# Patient Record
Sex: Female | Born: 1955 | Race: White | Hispanic: No | Marital: Married | Smoking: Never smoker
Health system: Southern US, Community
[De-identification: ages and names within clinical notes are randomized; demographics above are authoritative.]

---

## 1998-03-06 ENCOUNTER — Other Ambulatory Visit: Admission: RE | Admit: 1998-03-06 | Discharge: 1998-03-06 | Payer: Self-pay | Admitting: Obstetrics and Gynecology

## 1999-03-14 ENCOUNTER — Other Ambulatory Visit: Admission: RE | Admit: 1999-03-14 | Discharge: 1999-03-14 | Payer: Self-pay | Admitting: Obstetrics and Gynecology

## 1999-05-16 ENCOUNTER — Encounter: Admission: RE | Admit: 1999-05-16 | Discharge: 1999-05-16 | Payer: Self-pay | Admitting: Obstetrics and Gynecology

## 1999-05-16 ENCOUNTER — Encounter: Payer: Self-pay | Admitting: Obstetrics and Gynecology

## 2003-02-15 ENCOUNTER — Encounter: Admission: RE | Admit: 2003-02-15 | Discharge: 2003-02-15 | Payer: Self-pay | Admitting: Internal Medicine

## 2003-02-15 ENCOUNTER — Encounter: Payer: Self-pay | Admitting: Internal Medicine

## 2004-02-24 ENCOUNTER — Encounter: Admission: RE | Admit: 2004-02-24 | Discharge: 2004-02-24 | Payer: Self-pay | Admitting: Internal Medicine

## 2009-01-11 ENCOUNTER — Encounter: Admission: RE | Admit: 2009-01-11 | Discharge: 2009-01-11 | Payer: Self-pay | Admitting: Family Medicine

## 2010-06-15 IMAGING — MG MM DIGITAL SCREENING BILAT W/ CAD
4 series · 4 of 4 positions shown · non-contrast
Comparison: Prior studies.

DG SCREEN MAMMOGRAM BILATERAL
Bilateral CC and MLO view(s) were taken.

DIGITAL SCREENING MAMMOGRAM WITH CAD:

[R CC]
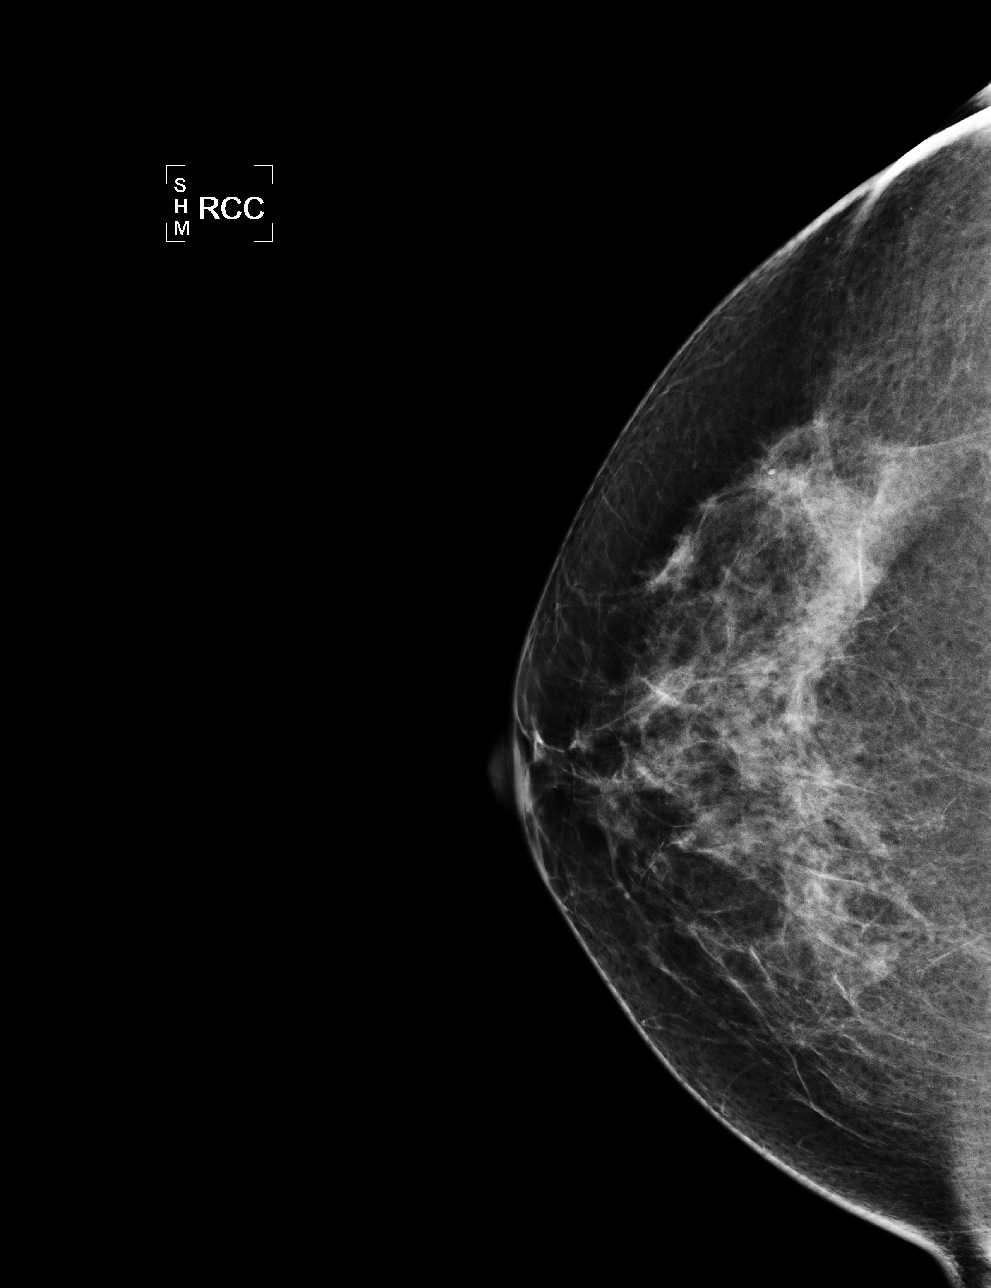

[L CC]
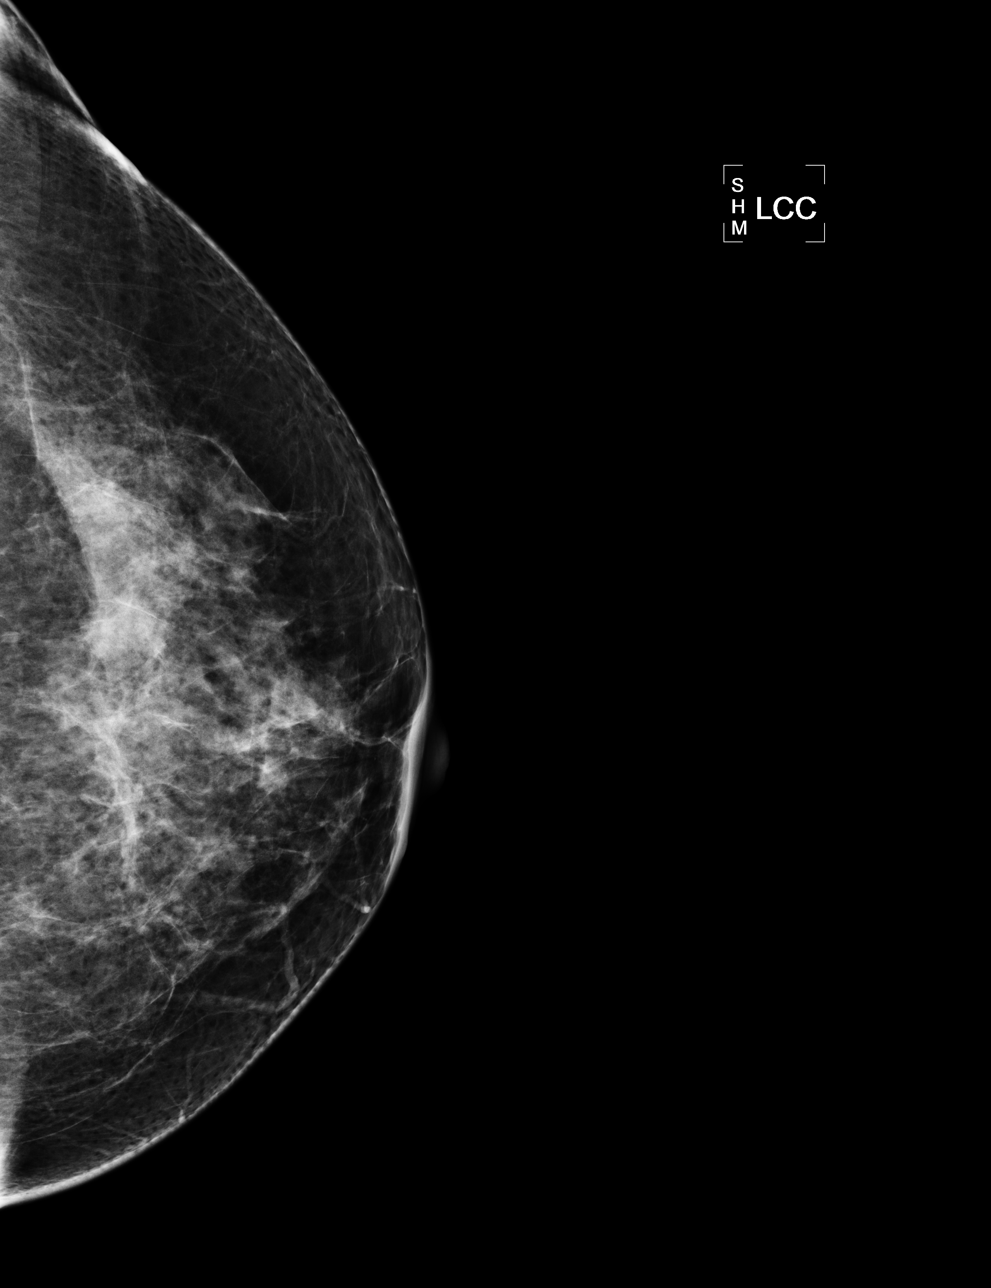

[L MLO]
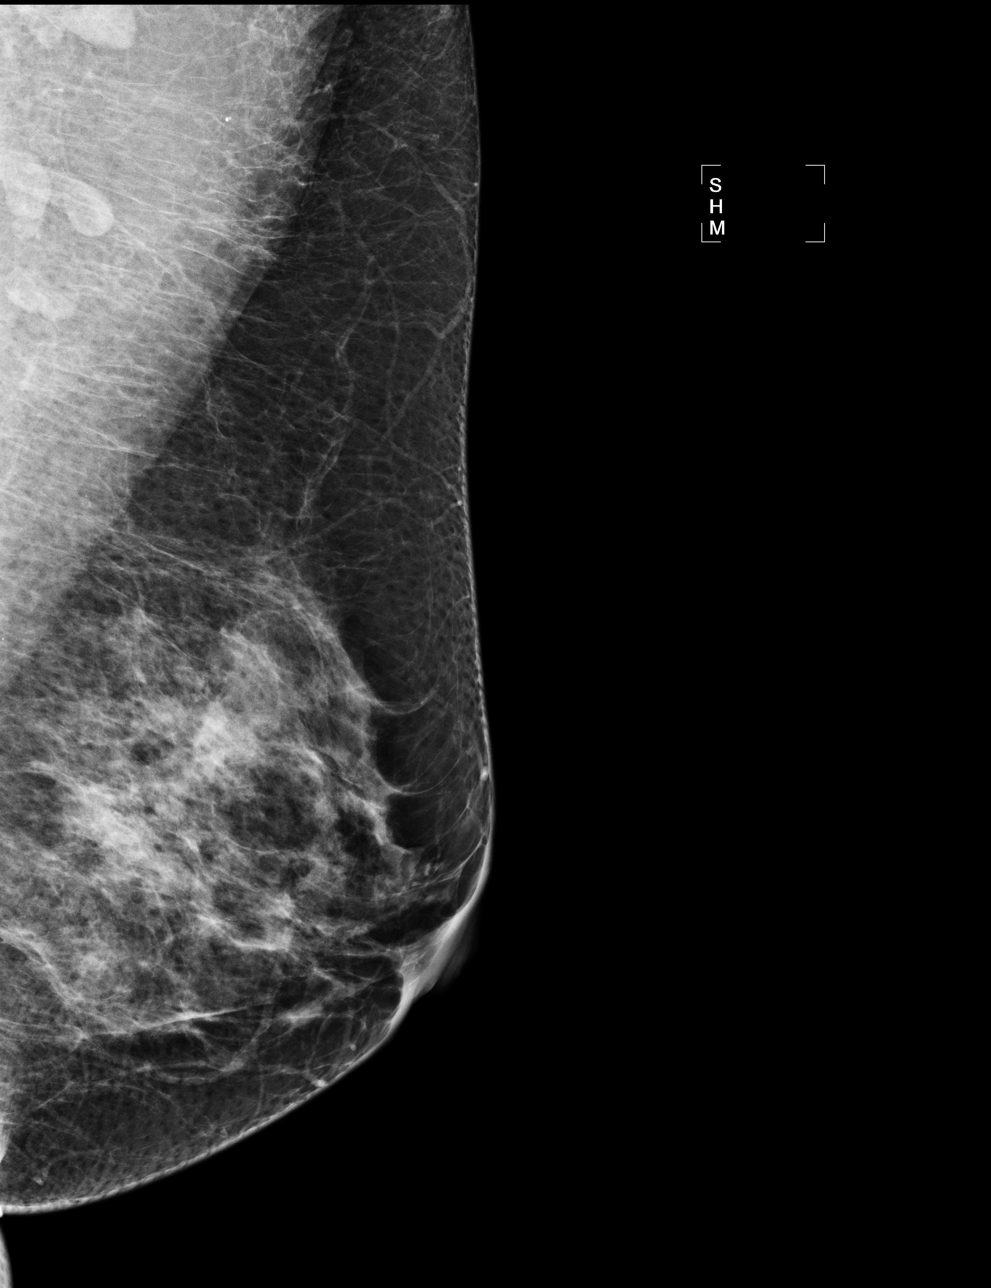

[R MLO]
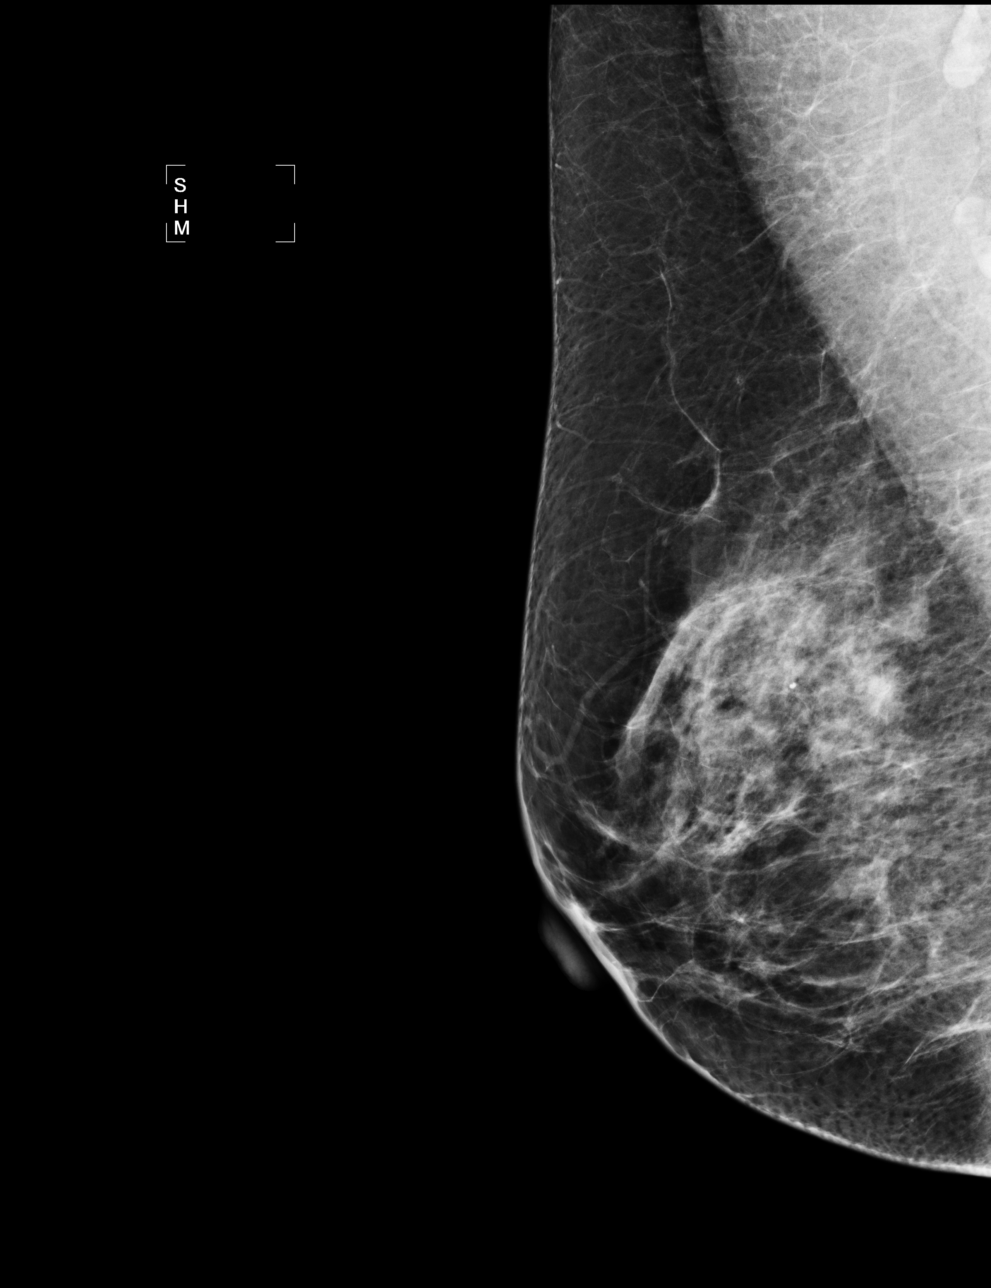

[4 of 4 positions shown; findings below may reference images not displayed]

The breast tissue is heterogeneously dense.  There is no dominant mass, architectural distortion or
calcification to suggest malignancy.

Images were processed with CAD.
IMPRESSION: No mammographic evidence of malignancy.  Suggest yearly screening mammography.

A result letter of this screening mammogram will be mailed directly to the patient.

ASSESSMENT: Negative - BI-RADS 1

Screening mammogram in 1 year.
ANALYZED BY COMPUTER AIDED DETECTION. , THIS PROCEDURE WAS A DIGITAL MAMMOGRAM.

## 2013-05-12 DIAGNOSIS — L219 Seborrheic dermatitis, unspecified: Secondary | ICD-10-CM | POA: Insufficient documentation

## 2013-05-12 DIAGNOSIS — Z0000 Encounter for general adult medical examination without abnormal findings: Secondary | ICD-10-CM | POA: Insufficient documentation

## 2013-05-12 DIAGNOSIS — E7800 Pure hypercholesterolemia, unspecified: Secondary | ICD-10-CM | POA: Insufficient documentation

## 2014-05-16 DIAGNOSIS — M8580 Other specified disorders of bone density and structure, unspecified site: Secondary | ICD-10-CM | POA: Insufficient documentation

## 2014-12-02 DIAGNOSIS — R7309 Other abnormal glucose: Secondary | ICD-10-CM | POA: Insufficient documentation

## 2021-03-20 ENCOUNTER — Other Ambulatory Visit: Payer: Self-pay | Admitting: Nurse Practitioner

## 2021-03-20 DIAGNOSIS — Z1231 Encounter for screening mammogram for malignant neoplasm of breast: Secondary | ICD-10-CM

## 2023-01-05 ENCOUNTER — Encounter (HOSPITAL_BASED_OUTPATIENT_CLINIC_OR_DEPARTMENT_OTHER): Payer: Self-pay | Admitting: Emergency Medicine

## 2023-01-05 ENCOUNTER — Emergency Department (HOSPITAL_BASED_OUTPATIENT_CLINIC_OR_DEPARTMENT_OTHER)
Admission: EM | Admit: 2023-01-05 | Discharge: 2023-01-05 | Disposition: A | Discharge status: Home / Self Care | Payer: Medicare HMO | Attending: Emergency Medicine | Admitting: Emergency Medicine

## 2023-01-05 ENCOUNTER — Emergency Department (HOSPITAL_BASED_OUTPATIENT_CLINIC_OR_DEPARTMENT_OTHER): Payer: Medicare HMO

## 2023-01-05 DIAGNOSIS — R0789 Other chest pain: Secondary | ICD-10-CM | POA: Insufficient documentation

## 2023-01-05 DIAGNOSIS — I10 Essential (primary) hypertension: Secondary | ICD-10-CM | POA: Insufficient documentation

## 2023-01-05 DIAGNOSIS — E119 Type 2 diabetes mellitus without complications: Secondary | ICD-10-CM | POA: Insufficient documentation

## 2023-01-05 DIAGNOSIS — R079 Chest pain, unspecified: Secondary | ICD-10-CM

## 2023-01-05 DIAGNOSIS — R42 Dizziness and giddiness: Secondary | ICD-10-CM | POA: Diagnosis not present

## 2023-01-05 LAB — BASIC METABOLIC PANEL
Anion gap: 8 (ref 5–15)
BUN: 17 mg/dL (ref 8–23)
CO2: 25 mmol/L (ref 22–32)
Calcium: 9.1 mg/dL (ref 8.9–10.3)
Chloride: 105 mmol/L (ref 98–111)
Creatinine, Ser: 0.71 mg/dL (ref 0.44–1.00)
GFR, Estimated: >60 mL/min (ref >60)
Glucose, Bld: 159 mg/dL — ABNORMAL HIGH (ref 70–99)
Potassium: 3.6 mmol/L (ref 3.5–5.1)
Sodium: 138 mmol/L (ref 135–145)

## 2023-01-05 LAB — CBC WITH DIFFERENTIAL/PLATELET
Abs Immature Granulocytes: 0.01 10*3/uL (ref 0.00–0.07)
Basophils Absolute: 0 10*3/uL (ref 0.0–0.1)
Basophils Relative: 1 %
Eosinophils Absolute: 0.1 10*3/uL (ref 0.0–0.5)
Eosinophils Relative: 2 %
HCT: 35.7 % — ABNORMAL LOW (ref 36.0–46.0)
Hemoglobin: 11.8 g/dL — ABNORMAL LOW (ref 12.0–15.0)
Immature Granulocytes: 0 %
Lymphocytes Relative: 36 %
Lymphs Abs: 2.2 10*3/uL (ref 0.7–4.0)
MCH: 31 pg (ref 26.0–34.0)
MCHC: 33.1 g/dL (ref 30.0–36.0)
MCV: 93.7 fL (ref 80.0–100.0)
Monocytes Absolute: 0.3 10*3/uL (ref 0.1–1.0)
Monocytes Relative: 5 %
Neutro Abs: 3.4 10*3/uL (ref 1.7–7.7)
Neutrophils Relative %: 56 %
Platelets: 183 10*3/uL (ref 150–400)
RBC: 3.81 MIL/uL — ABNORMAL LOW (ref 3.87–5.11)
RDW: 13 % (ref 11.5–15.5)
WBC: 6 10*3/uL (ref 4.0–10.5)
nRBC: 0 % (ref 0.0–0.2)

## 2023-01-05 LAB — TROPONIN I (HIGH SENSITIVITY): Troponin I (High Sensitivity): <2 ng/L (ref <18)

## 2023-01-05 NOTE — ED Provider Notes (Signed)
[place] [facility] Provider Note   CSN: [id number] Arrival date & time: 01/05/23  4098     History  Chief Complaint  Patient presents with   Chest Pain    Sarah Campos is a 67 y.o. female.  Patient here with chest discomfort.  She has been having chest discomfort for the last 8 to 10 hours.  May be some chest pain here and there in the last few weeks.  Nonexertional.  Patient feels little lightheaded at times but denies any headache or numbness or tingling.  No speech changes.  Denies any shortness of breath or recent surgery or travel.  Denies any abdominal pain nausea vomit diarrhea.  She has no medical problems.  Does not take any medicine for hypertension, diabetes or high cholesterol.  No blood clot history in the family, no cardiac disease at young age in the family.  Denies any cough or sputum production.  The history is provided by the patient.       Home Medications Prior to Admission medications   Not on File      Allergies    Morphine    Review of Systems   Review of Systems  Physical Exam Updated Vital Signs BP 129/71   Pulse 72   Temp (!) 97.5 F (36.4 C) (Oral)   Resp 20   Ht 5\' 3"  (1.6 m)   Wt 63 kg   SpO2 95%   BMI 24.62 kg/m  Physical Exam Vitals and nursing note reviewed.  Constitutional:      General: She is not in acute distress.    Appearance: She is well-developed. She is not ill-appearing.  HENT:     Head: Normocephalic and atraumatic.  Eyes:     Extraocular Movements: Extraocular movements intact.     Conjunctiva/sclera: Conjunctivae normal.     Pupils: Pupils are equal, round, and reactive to light.  Cardiovascular:     Rate and Rhythm: Normal rate and regular rhythm.     Pulses:          Radial pulses are 2+ on the right side and 2+ on the left side.     Heart sounds: No murmur heard. Pulmonary:     Effort: Pulmonary effort is normal. No respiratory distress.     Breath sounds: Normal  breath sounds.  Abdominal:     Palpations: Abdomen is soft.     Tenderness: There is no abdominal tenderness.  Musculoskeletal:        General: No swelling. Normal range of motion.     Cervical back: Normal range of motion and neck supple.  Skin:    General: Skin is warm and dry.     Capillary Refill: Capillary refill takes less than 2 seconds.  Neurological:     General: No focal deficit present.     Mental Status: She is alert and oriented to person, place, and time.     Cranial Nerves: No cranial nerve deficit.     Motor: No weakness.  Psychiatric:        Mood and Affect: Mood normal.     ED Results / Procedures / Treatments   Labs (all labs ordered are listed, but only abnormal results are displayed) Labs Reviewed  CBC WITH DIFFERENTIAL/PLATELET - Abnormal; Notable for the following components:      Result Value   RBC 3.81 (*)    Hemoglobin 11.8 (*)    HCT 35.7 (*)    All  other components within normal limits  BASIC METABOLIC PANEL - Abnormal; Notable for the following components:   Glucose, Bld 159 (*)    All other components within normal limits  TROPONIN I (HIGH SENSITIVITY)    EKG EKG Interpretation Date/Time:  Sunday January 05 2023 08:02:50 EDT Ventricular Rate:  74 PR Interval:  135 QRS Duration:  88 QT Interval:  380 QTC Calculation: 422 R Axis:   48  Text Interpretation: Sinus rhythm Confirmed by Virgina Norfolk [id number]) on 01/05/2023 8:04:49 AM  Radiology No results found.  Procedures Procedures    Medications Ordered in ED Medications - No data to display  ED Course/ Medical Decision Making/ A&P             HEART Score: 2                Medical Decision Making Amount and/or Complexity of Data Reviewed Labs: ordered. Radiology: ordered.   Sarah Campos is here with chest pain.  Normal vitals.  No fever.  No significant medical history.  Wells criteria 0 and doubt PE.  Heart score 2.  EKG shows sinus rhythm with no ischemic changes per my  review and interpretation.  Overall atypical story for ACS.  She has been having chest pain on and off for the last month that is nonspecific.  Happens at rest maybe with exertion not sure if it is worse with eating or not.  Overall chest pain now for the last 8 to 10 hours.  Was having a hard time sleeping.  Differential diagnosis could be MSK or reflux related process or stress related process seems less likely to be ACS, Wells criteria 0 doubt PE.  No concern for dissection.  Seems less likely to be infectious process.  Will check CBC, BMP, troponin and reevaluate.  Per my review and interpretation of labs and imaging there is no acute findings.  No pneumonia or pneumothorax.  Troponin undetectable.  No significant anemia, electrolyte abnormality, kidney injury or leukocytosis otherwise.  Overall low heart score.  Atypical story.  Will have her continue workup outpatient with cardiology.  Recommend follow-up with primary care doctor.  Understands return precautions.  Discharged in good condition.  This chart was dictated using voice recognition software.  Despite best efforts to proofread,  errors can occur which can change the documentation meaning.         Final Clinical Impression(s) / ED Diagnoses Final diagnoses:  Nonspecific chest pain    Rx / DC Orders ED Discharge Orders          Ordered    Ambulatory referral to Cardiology       Comments: If you have not heard from the Cardiology office within the next 72 hours please call [phone].   01/05/23 0848              Virgina Norfolk, DO 01/05/23 [phone]

## 2023-01-05 NOTE — Discharge Instructions (Signed)
Follow-up with primary care doctor.  I have also placed an order for referral to cardiology.  Please call them if you have not heard from them.  Your workup today was unremarkable.

## 2023-01-05 NOTE — ED Triage Notes (Signed)
Pt reports upper chest pain intermittently for the past month but seems to be getting worse today. Some lightheadedness, but hx of vertigo and dizziness feels similar. No shortness of breath or nausea.

## 2023-03-19 ENCOUNTER — Encounter: Payer: Self-pay | Admitting: Cardiology

## 2023-03-19 ENCOUNTER — Ambulatory Visit: Payer: Medicare HMO | Attending: Cardiology | Admitting: Cardiology

## 2023-03-19 VITALS — BP 130/78 | HR 76 | Ht 62.5 in | Wt 136.0 lb

## 2023-03-19 DIAGNOSIS — R011 Cardiac murmur, unspecified: Secondary | ICD-10-CM | POA: Diagnosis not present

## 2023-03-19 DIAGNOSIS — E7800 Pure hypercholesterolemia, unspecified: Secondary | ICD-10-CM

## 2023-03-19 DIAGNOSIS — R079 Chest pain, unspecified: Secondary | ICD-10-CM | POA: Diagnosis not present

## 2023-03-19 DIAGNOSIS — R7309 Other abnormal glucose: Secondary | ICD-10-CM | POA: Diagnosis not present

## 2023-03-19 MED ORDER — METOPROLOL TARTRATE 100 MG PO TABS: ORAL_TABLET | ORAL | 0 refills | Status: DC

## 2023-03-19 MED ORDER — NITROGLYCERIN 0.4 MG SL SUBL: 0.4000 mg | SUBLINGUAL_TABLET | SUBLINGUAL | 6 refills | Status: AC | PRN

## 2023-03-19 NOTE — Patient Instructions (Addendum)
Medication Instructions:   TAKE: Metoprolol 100mg  1 tablet 2 hours prior to CT scan   Lab Work: BMP- today 3rd Floor [address] If you have labs (blood work) drawn today and your tests are completely normal, you will receive your results only by: MyChart Message (if you have MyChart) OR A paper copy in the mail If you have any lab test that is abnormal or we need to change your treatment, we will call you to review the results.   Testing/Procedures: Your physician has requested that you have an echocardiogram. Echocardiography is a painless test that uses sound waves to create images of your heart. It provides your doctor with information about the size and shape of your heart and how well your heart's chambers and valves are working. This procedure takes approximately one hour. There are no restrictions for this procedure. Please do NOT wear cologne, perfume, aftershave, or lotions (deodorant is allowed). Please arrive 15 minutes prior to your appointment time.   Your cardiac CT will be scheduled at one of the below locations:   [facility] [address] [place], [place] [address] [phone]    If scheduled at [facility], please arrive at the [facility] Entrance (Entrance C2) of [facility] 30 minutes prior to test start time. You can use the FREE valet parking offered at entrance C (encouraged to control the heart rate for the test)  Proceed to the [facility] Radiology Department (first floor) to check-in and test prep.  All radiology patients and guests should use entrance C2 at [facility], accessed from [facility], even though the hospital's physical address listed is [address]       Please follow these instructions carefully (unless otherwise directed):     On the Night Before the Test: Be sure to Drink plenty of water. Do not consume any caffeinated/decaffeinated beverages or chocolate  12 hours prior to your test. Do not take any antihistamines 12 hours prior to your test.  On the Day of the Test: Drink plenty of water until 1 hour prior to the test. Do not eat any food 4 hours prior to the test. You may take your regular medications prior to the test.  Take metoprolol (Lopressor) two hours prior to test. FEMALES- please wear underwire-free bra if available, avoid dresses & tight clothing       After the Test: Drink plenty of water. After receiving IV contrast, you may experience a mild flushed feeling. This is normal. On occasion, you may experience a mild rash up to 24 hours after the test. This is not dangerous. If this occurs, you can take Benadryl 25 mg and increase your fluid intake. If you experience trouble breathing, this can be serious. If it is severe call 911 IMMEDIATELY. If it is mild, please call our office. If you take any of these medications: Glipizide/Metformin, Avandament, Glucavance, please do not take 48 hours after completing test unless otherwise instructed.  We will call to schedule your test 2-4 weeks out understanding that some insurance companies will need an authorization prior to the service being performed.   For non-scheduling related questions, please contact the cardiac imaging nurse navigator should you have any questions/concerns: Rockwell Alexandria, Cardiac Imaging Nurse Navigator Larey Brick, Cardiac Imaging Nurse Navigator [facility] Direct Office Dial: [phone]   For scheduling needs, including cancellations and rescheduling, please call [place], [phone].     Follow-Up: At  [facility], you and your health needs are our priority.  As part of our continuing mission to provide you with exceptional heart care, we have created designated Provider Care Teams.  These Care Teams include your primary Cardiologist (physician) and Advanced Practice Providers (APPs -  Physician Assistants and Nurse  Practitioners) who all work together to provide you with the care you need, when you need it.  We recommend signing up for the patient portal called "MyChart".  Sign up information is provided on this After Visit Summary.  MyChart is used to connect with patients for Virtual Visits (Telemedicine).  Patients are able to view lab/test results, encounter notes, upcoming appointments, etc.  Non-urgent messages can be sent to your provider as well.   To learn more about what you can do with MyChart, go to [email or web address].    Your next appointment:   2 month(s)  The format for your next appointment:   In Person  Provider:   Gypsy Balsam, MD    Other Instructions NA

## 2023-03-19 NOTE — Progress Notes (Signed)
Cardiology Consultation:    Date:  03/19/2023   ID:  Sarah Campos, DOB 1956/03/17, MRN [id number]  PCP:  Pcp, No  Cardiologist:  Gypsy Balsam, MD   Referring MD: Virgina Norfolk, DO   Chief Complaint  Patient presents with   Medication Management    Statin intolerance     History of Present Illness:    Sarah Campos is a 67 y.o. female who is being seen today for the evaluation of chest pain at the request of Virgina Norfolk, DO.  Past medical history significant for dyslipidemia with intolerance to statin, 3 months ago she started having hurting in the chest it was heavy like sensation no shortness of breath no sweating no relieving no aggravating factors at this sensation lasted for 2 days eventually she ended up going to the emergency room.  In the emergency room biochemical markers were negative she was discharged home with normal EKG.  Since that time she has been hurting for not a whole week continuously the strongest sensation was 8 scale up to 10 and then everything subsided since that time she has no symptoms.  She was very energetic she does not exercise on the regular basis but will climb stairs with no difficulties denies having any more chest pain tightness squeezing pressure burning chest.  She never smoked but does have family history of premature coronary artery disease, apparently her father got a problem before age 29.  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  History reviewed. No pertinent past medical history.  History reviewed. No pertinent surgical history.  Current Medications: Current Meds  Medication Sig   ascorbic acid (VITAMIN C) 500 MG tablet Take 500 mg by mouth 2 (two) times daily.   aspirin EC 81 MG tablet Take 81 mg by mouth daily.   B Complex-C (SUPER B/C) CAPS Take 1 capsule by mouth daily.   calcium carbonate (SUPER CALCIUM) 1500 (600 Ca) MG TABS tablet Take 1,500 mg by mouth  daily with breakfast.   Cholecalciferol 75 MCG (3000 UT) TABS Take 3,000 Units by mouth daily.   CINNAMON PO Take 1 tablet by mouth daily.   Coenzyme Q10 10 MG capsule Take 10 mg by mouth daily.   Fructooligosaccharides (FOS PO) Take 1 tablet by mouth at bedtime.   GARLIC PO Take 1 capsule by mouth daily.   GINKGO BILOBA EXTRACT PO Take 1 tablet by mouth daily.   Multiple Vitamin (MULTIVITAMIN) capsule Take 1 capsule by mouth daily.   Omega-3 Krill Oil 1000 MG CAPS Take 1 capsule by mouth daily.   PSYLLIUM PO Take 1 packet by mouth daily.   [DISCONTINUED] COCONUT OIL PO Take 1 capsule by mouth daily.   [DISCONTINUED] Docusate Sodium (DSS) 100 MG CAPS Take 200 mg by mouth at bedtime as needed (Constipation).   [DISCONTINUED] mometasone (ELOCON) 0.1 % cream Apply 1 Application topically daily.   [DISCONTINUED] NOREL AD 4-10-325 MG TABS Take 1 tablet by mouth daily.   [DISCONTINUED] ofloxacin (OCUFLOX) 0.3 % ophthalmic solution Place 1 drop into both eyes 4 (four) times daily.   [DISCONTINUED] simvastatin (ZOCOR) 40 MG tablet Take 40 mg by mouth daily at 6 PM.     Allergies:   Codeine, Morphine, and Atorvastatin   Social History   Socioeconomic History   Marital status: Married    Spouse name: Not on file   Number of children: Not on file  Years of education: Not on file   Highest education level: Not on file  Occupational History   Not on file  Tobacco Use   Smoking status: Never   Smokeless tobacco: Never  Substance and Sexual Activity   Alcohol use: Never   Drug use: Yes   Sexual activity: Not on file  Other Topics Concern   Not on file  Social History Narrative   Not on file   Social Determinants of Health   Financial Resource Strain: Not on file  Food Insecurity: Not on file  Transportation Needs: Not on file  Physical Activity: Not on file  Stress: Not on file  Social Connections: Not on file     Family History: The patient's family history includes Cancer in  her maternal grandfather, maternal grandmother, maternal uncle, and mother; Diabetes in her brother, father, maternal grandfather, and maternal grandmother; Hyperlipidemia in her father; Hypertension in her father. ROS:   Please see the history of present illness.    All 14 point review of systems negative except as described per history of present illness.  EKGs/Labs/Other Studies Reviewed:    The following studies were reviewed today:   EKG:       Recent Labs: 01/05/2023: BUN 17; Creatinine, Ser 0.71; Hemoglobin 11.8; Platelets 183; Potassium 3.6; Sodium 138  Recent Lipid Panel No results found for: "CHOL", "TRIG", "HDL", "CHOLHDL", "VLDL", "LDLCALC", "LDLDIRECT"  Physical Exam:    VS:  BP 130/78 (BP Location: Left Arm, Patient Position: Sitting)   Pulse 76   Ht 5' 2.5" (1.588 m)   Wt 136 lb (61.7 kg)   SpO2 92%   BMI 24.48 kg/m     Wt Readings from Last 3 Encounters:  03/19/23 136 lb (61.7 kg)  01/05/23 139 lb (63 kg)     GEN:  Well nourished, well developed in no acute distress HEENT: Normal NECK: No JVD; No carotid bruits LYMPHATICS: No lymphadenopathy CARDIAC: RRR, systolic extra murmur grade 2/6 best heard right upper portion of the sternum, no rubs, no gallops RESPIRATORY:  Clear to auscultation without rales, wheezing or rhonchi  ABDOMEN: Soft, non-tender, non-distended MUSCULOSKELETAL:  No edema; No deformity  SKIN: Warm and dry NEUROLOGIC:  Alert and oriented x 3 PSYCHIATRIC:  Normal affect   ASSESSMENT:    1. Chest pain of uncertain etiology   2. Hypercholesterolemia   3. Elevated glucose    PLAN:    In order of problems listed above:  Chest pain with some suspicious characteristic however the fact that pain lasted for few days make it unlikely to be ischemic still with her risk factors we must rule out coronary artery disease.  I will schedule her to have coronary CT angio I will also give her nitroglycerin as needed and instruction how to take it she  is already on aspirin which I will continue. Dyslipidemia I did review her K PN which show me her cholesterol being elevated with LDL of 139 HDL 51.  She is reluctant to take statin will wait for results of coronary CT angio before committing her to the medication. Elevated glucose we will continue following it exercises will be recommended. Physical exam revealed systolic heart murmur look like aortic stenosis echocardiogram will be done to clarify that   Medication Adjustments/Labs and Tests Ordered: Current medicines are reviewed at length with the patient today.  Concerns regarding medicines are outlined above.  Orders Placed This Encounter  Procedures   EKG 12-Lead   No orders of the defined  types were placed in this encounter.   Signed, Georgeanna Lea, MD, [facility]. 03/19/2023 2:23 PM     [facility]

## 2023-03-19 NOTE — Addendum Note (Signed)
Addended by: Baldo Ash D on: 03/19/2023 02:42 PM   Modules accepted: Orders

## 2023-03-20 LAB — BASIC METABOLIC PANEL
BUN/Creatinine Ratio: 21 (ref 12–28)
BUN: 16 mg/dL (ref 8–27)
CO2: 25 mmol/L (ref 20–29)
Calcium: 9.2 mg/dL (ref 8.7–10.3)
Chloride: 102 mmol/L (ref 96–106)
Creatinine, Ser: 0.78 mg/dL (ref 0.57–1.00)
Glucose: 97 mg/dL (ref 70–99)
Potassium: 4.6 mmol/L (ref 3.5–5.2)
Sodium: 140 mmol/L (ref 134–144)
eGFR: 82 mL/min/{1.73_m2} (ref >59)

## 2023-03-25 ENCOUNTER — Telehealth: Payer: Self-pay

## 2023-03-25 NOTE — Telephone Encounter (Signed)
-----   Message from Gypsy Balsam sent at 03/21/2023 12:19 PM EDT ----- Chem-7 looks good, proceed with coronary CT angio

## 2023-03-25 NOTE — Telephone Encounter (Signed)
Patient notified of results and verbalized understanding.  

## 2023-03-27 ENCOUNTER — Telehealth (HOSPITAL_COMMUNITY): Payer: Self-pay | Admitting: *Deleted

## 2023-03-27 NOTE — Telephone Encounter (Signed)
Received call from patient regarding upcoming cardiac imaging study; pt verbalizes understanding of appt date/time, parking situation and where to check in, pre-test NPO status and medications ordered, and verified current allergies; name and call back number provided for further questions should they arise Johney Frame RN Navigator Cardiac Imaging [facility] [phone] office [phone] cell

## 2023-03-27 NOTE — Telephone Encounter (Signed)
Attempted to call patient regarding upcoming cardiac CT appointment. Left message on voicemail with name and callback number Johney Frame RN Navigator Cardiac Imaging [facility] [phone] Office

## 2023-03-28 ENCOUNTER — Ambulatory Visit (HOSPITAL_COMMUNITY)
Admission: RE | Admit: 2023-03-28 | Discharge: 2023-03-28 | Disposition: A | Discharge status: Home / Self Care | Payer: Medicare HMO | Source: Ambulatory Visit | Attending: Cardiology | Admitting: Cardiology

## 2023-03-28 DIAGNOSIS — R079 Chest pain, unspecified: Secondary | ICD-10-CM | POA: Diagnosis present

## 2023-03-28 DIAGNOSIS — R0609 Other forms of dyspnea: Secondary | ICD-10-CM | POA: Diagnosis not present

## 2023-03-28 DIAGNOSIS — I2510 Atherosclerotic heart disease of native coronary artery without angina pectoris: Secondary | ICD-10-CM | POA: Insufficient documentation

## 2023-03-28 DIAGNOSIS — R072 Precordial pain: Secondary | ICD-10-CM | POA: Diagnosis not present

## 2023-03-28 MED ORDER — SODIUM CHLORIDE 0.9 % IV BOLUS
500.0000 mL | Freq: Once | INTRAVENOUS | Status: AC
  Administered 2023-03-28: 500 mL via INTRAVENOUS

## 2023-03-28 MED ORDER — NITROGLYCERIN 0.4 MG SL SUBL
SUBLINGUAL_TABLET | SUBLINGUAL | Status: AC
  Filled 2023-03-28: qty 2

## 2023-03-28 MED ORDER — NITROGLYCERIN 0.4 MG SL SUBL
0.8000 mg | SUBLINGUAL_TABLET | Freq: Once | SUBLINGUAL | Status: AC
  Administered 2023-03-28: 0.8 mg via SUBLINGUAL

## 2023-03-28 MED ORDER — IOHEXOL 350 MG/ML SOLN
95.0000 mL | Freq: Once | INTRAVENOUS | Status: AC | PRN
  Administered 2023-03-28: 95 mL via INTRAVENOUS

## 2023-04-15 ENCOUNTER — Ambulatory Visit (HOSPITAL_BASED_OUTPATIENT_CLINIC_OR_DEPARTMENT_OTHER)
Admission: RE | Admit: 2023-04-15 | Discharge: 2023-04-15 | Disposition: A | Discharge status: Home / Self Care | Payer: Medicare HMO | Source: Ambulatory Visit | Attending: Cardiology | Admitting: Cardiology

## 2023-04-15 DIAGNOSIS — R011 Cardiac murmur, unspecified: Secondary | ICD-10-CM

## 2023-04-15 LAB — ECHOCARDIOGRAM COMPLETE
AR max vel: 1.89 cm2
AV Area VTI: 1.94 cm2
AV Area mean vel: 1.72 cm2
AV Mean grad: 5 mm[Hg]
AV Peak grad: 8.9 mm[Hg]
Ao pk vel: 1.49 m/s
Area-P 1/2: 4.49 cm2
Calc EF: 65.3 %
S' Lateral: 2.4 cm
Single Plane A2C EF: 62.9 %
Single Plane A4C EF: 67.4 %

## 2023-04-23 ENCOUNTER — Telehealth: Payer: Self-pay

## 2023-04-23 NOTE — Telephone Encounter (Signed)
-----   Message from Norman Herrlich sent at 04/17/2023  8:09 PM EDT ----- Normal echocardiogram another good report

## 2023-04-23 NOTE — Telephone Encounter (Signed)
Patient notified through my chart.

## 2023-05-20 ENCOUNTER — Ambulatory Visit: Payer: Medicare HMO | Admitting: Cardiology

## 2023-05-22 ENCOUNTER — Ambulatory Visit: Payer: Medicare HMO | Admitting: Cardiology

## 2023-07-18 ENCOUNTER — Ambulatory Visit: Payer: Medicare Other | Attending: Cardiology | Admitting: Cardiology

## 2023-07-18 ENCOUNTER — Encounter: Payer: Self-pay | Admitting: Cardiology

## 2023-07-18 VITALS — BP 122/74 | HR 88 | Ht 62.0 in | Wt 139.0 lb

## 2023-07-18 DIAGNOSIS — R079 Chest pain, unspecified: Secondary | ICD-10-CM

## 2023-07-18 DIAGNOSIS — E7800 Pure hypercholesterolemia, unspecified: Secondary | ICD-10-CM

## 2023-07-18 DIAGNOSIS — R7309 Other abnormal glucose: Secondary | ICD-10-CM

## 2023-07-18 NOTE — Progress Notes (Signed)
 Cardiology Office Note:    Date:  07/18/2023   ID:  Sarah Campos, DOB July 01, 1956, MRN [id number]  PCP:  Pcp, No  Cardiologist:  Lamar Fitch, MD    Referring MD: No ref. provider found   Chief Complaint  Patient presents with   Follow-up    History of Present Illness:    Sarah Campos is a 68 y.o. female past medical history significant for dyslipidemia, borderline diabetes she was sent to us  because of heart murmur as well as atypical chest pain.  Coronary CT angio has been performed was normal calcium score 0 no coronary artery disease chart rapt score 0.  She also had echocardiogram which did not show any significant valvular pathology.  Comes today to months to follow-up on that.  Overall doing very well like always very energetic and very happy today.  She walks on the regular basis trying to stay healthy  History reviewed. No pertinent past medical history.  History reviewed. No pertinent surgical history.  Current Medications: Current Meds  Medication Sig   ascorbic acid (VITAMIN C) 500 MG tablet Take 500 mg by mouth 2 (two) times daily.   aspirin EC 81 MG tablet Take 81 mg by mouth daily.   B Complex-C (SUPER B/C) CAPS Take 1 capsule by mouth daily.   calcium carbonate (SUPER CALCIUM) 1500 (600 Ca) MG TABS tablet Take 1,500 mg by mouth daily with breakfast.   Cholecalciferol 75 MCG (3000 UT) TABS Take 3,000 Units by mouth daily.   CINNAMON PO Take 1 tablet by mouth daily.   Coenzyme Q10 10 MG capsule Take 10 mg by mouth daily.   Fructooligosaccharides (FOS PO) Take 1 tablet by mouth at bedtime.   GARLIC PO Take 1 capsule by mouth daily.   GINKGO BILOBA EXTRACT PO Take 1 tablet by mouth daily.   Multiple Vitamin (MULTIVITAMIN) capsule Take 1 capsule by mouth daily.   nitroGLYCERIN  (NITROSTAT ) 0.4 MG SL tablet Place 1 tablet (0.4 mg total) under the tongue every 5 (five) minutes as needed for chest pain.   Omega-3 Krill Oil 1000 MG CAPS Take 1 capsule by mouth  daily.   PSYLLIUM PO Take 1 packet by mouth daily.   [DISCONTINUED] metoprolol  tartrate (LOPRESSOR ) 100 MG tablet Take one tablet 2 hours before cardiac CT for heart greater than 55     Allergies:   Codeine, Morphine, and Atorvastatin   Social History   Socioeconomic History   Marital status: Married    Spouse name: Not on file   Number of children: Not on file   Years of education: Not on file   Highest education level: Not on file  Occupational History   Not on file  Tobacco Use   Smoking status: Never   Smokeless tobacco: Never  Substance and Sexual Activity   Alcohol use: Never   Drug use: Yes   Sexual activity: Not on file  Other Topics Concern   Not on file  Social History Narrative   Not on file   Social Drivers of Health   Financial Resource Strain: Not on file  Food Insecurity: Not on file  Transportation Needs: Not on file  Physical Activity: Not on file  Stress: Not on file  Social Connections: Not on file     Family History: The patient's family history includes Cancer in her maternal grandfather, maternal grandmother, maternal uncle, and mother; Diabetes in her brother, father, maternal grandfather, and maternal grandmother; Hyperlipidemia in her father; Hypertension in her  father. ROS:   Please see the history of present illness.    All 14 point review of systems negative except as described per history of present illness  EKGs/Labs/Other Studies Reviewed:         Recent Labs: 01/05/2023: Hemoglobin 11.8; Platelets 183 03/19/2023: BUN 16; Creatinine, Ser 0.78; Potassium 4.6; Sodium 140  Recent Lipid Panel No results found for: CHOL, TRIG, HDL, CHOLHDL, VLDL, LDLCALC, LDLDIRECT  Physical Exam:    VS:  BP 122/74 (BP Location: Right Arm, Patient Position: Sitting)   Pulse 88   Ht 5' 2 (1.575 m)   Wt 139 lb (63 kg)   SpO2 95%   BMI 25.42 kg/m     Wt Readings from Last 3 Encounters:  07/18/23 139 lb (63 kg)  03/19/23 136 lb  (61.7 kg)  01/05/23 139 lb (63 kg)     GEN:  Well nourished, well developed in no acute distress HEENT: Normal NECK: No JVD; No carotid bruits LYMPHATICS: No lymphadenopathy CARDIAC: RRR, no murmurs, no rubs, no gallops RESPIRATORY:  Clear to auscultation without rales, wheezing or rhonchi  ABDOMEN: Soft, non-tender, non-distended MUSCULOSKELETAL:  No edema; No deformity  SKIN: Warm and dry LOWER EXTREMITIES: no swelling NEUROLOGIC:  Alert and oriented x 3 PSYCHIATRIC:  Normal affect   ASSESSMENT:    1. Chest pain of uncertain etiology   2. Hypercholesterolemia   3. Elevated glucose    PLAN:    In order of problems listed above:  Chest pain coronary CT angio absolutely normal calcium score 0 I think we can drop if you have coronary disease.  We did do calculation of 10 years predicted risk based on her profile and it is 2.3% only.  Her coronary age is 51 years.  In this case we do need to put her on cholesterol medication.  We did talk about aspirin there is no indication to continue however she wants to she can.  She need to stay for that she need to keep good diet to prevent her from developing diabetes.  She said after she cut down all sweet drinks her hemoglobin A1c now is within normal range. Dyslipidemia no need to treat. Elevated glucose now A1c within normal limits.  I encouraged her to continue exercise and sticking with good diet.   Medication Adjustments/Labs and Tests Ordered: Current medicines are reviewed at length with the patient today.  Concerns regarding medicines are outlined above.  No orders of the defined types were placed in this encounter.  Medication changes: No orders of the defined types were placed in this encounter.   Signed, Lamar DOROTHA Fitch, MD, [facility] 07/18/2023 8:39 AM    [facility]

## 2023-07-18 NOTE — Patient Instructions (Addendum)
 Medication Instructions:  Your physician recommends that you continue on your current medications as directed. Please refer to the Current Medication list given to you today.  *If you need a refill on your cardiac medications before your next appointment, please call your pharmacy*   Lab Work: None Ordered If you have labs (blood work) drawn today and your tests are completely normal, you will receive your results only by: MyChart Message (if you have MyChart) OR A paper copy in the mail If you have any lab test that is abnormal or we need to change your treatment, we will call you to review the results.   Testing/Procedures: Your physician has requested that you have an echocardiogram. Echocardiography is a painless test that uses sound waves to create images of your heart. It provides your doctor with information about the size and shape of your heart and how well your heart's chambers and valves are working. This procedure takes approximately one hour. There are no restrictions for this procedure. Please do NOT wear cologne, perfume, aftershave, or lotions (deodorant is allowed). Please arrive 15 minutes prior to your appointment time.  Please note: We ask at that you not bring children with you during ultrasound (echo/ vascular) testing. Due to room size and safety concerns, children are not allowed in the ultrasound rooms during exams. Our front office staff cannot provide observation of children in our lobby area while testing is being conducted. An adult accompanying a patient to their appointment will only be allowed in the ultrasound room at the discretion of the ultrasound technician under special circumstances. We apologize for any inconvenience.    Follow-Up: At [facility], you and your health needs are our priority.  As part of our continuing mission to provide you with exceptional heart care, we have created designated Provider Care Teams.  These Care Teams include your  primary Cardiologist (physician) and Advanced Practice Providers (APPs -  Physician Assistants and Nurse Practitioners) who all work together to provide you with the care you need, when you need it.  We recommend signing up for the patient portal called MyChart.  Sign up information is provided on this After Visit Summary.  MyChart is used to connect with patients for Virtual Visits (Telemedicine).  Patients are able to view lab/test results, encounter notes, upcoming appointments, etc.  Non-urgent messages can be sent to your provider as well.   To learn more about what you can do with MyChart, go to [email or web address].    Your next appointment:   12 month(s)  The format for your next appointment:   In Person  Provider:   Lamar Fitch, MD    Other Instructions NA
